# Patient Record
Sex: Male | Born: 1985 | Race: White | Hispanic: No | Marital: Married | Smoking: Never smoker
Health system: Southern US, Community
[De-identification: ages and names within clinical notes are randomized; demographics above are authoritative.]

## PROBLEM LIST (undated history)

## (undated) DIAGNOSIS — IMO0001 Reserved for inherently not codable concepts without codable children: Secondary | ICD-10-CM

## (undated) HISTORY — PX: WISDOM TOOTH EXTRACTION: SHX21

## (undated) HISTORY — DX: Reserved for inherently not codable concepts without codable children: IMO0001

---

## 2014-05-15 ENCOUNTER — Ambulatory Visit: Payer: Self-pay | Admitting: Sports Medicine

## 2015-11-09 ENCOUNTER — Other Ambulatory Visit (HOSPITAL_COMMUNITY)
Admission: RE | Admit: 2015-11-09 | Discharge: 2015-11-09 | Disposition: A | Discharge status: Home / Self Care | Payer: BLUE CROSS/BLUE SHIELD | Source: Ambulatory Visit | Attending: Family Medicine | Admitting: Family Medicine

## 2015-11-09 ENCOUNTER — Ambulatory Visit (INDEPENDENT_AMBULATORY_CARE_PROVIDER_SITE_OTHER): Payer: BLUE CROSS/BLUE SHIELD | Admitting: Family Medicine

## 2015-11-09 ENCOUNTER — Encounter: Payer: Self-pay | Admitting: Family Medicine

## 2015-11-09 VITALS — BP 110/56 | HR 56 | Temp 97.8°F | Ht 74.0 in | Wt 204.0 lb

## 2015-11-09 DIAGNOSIS — Z7251 High risk heterosexual behavior: Secondary | ICD-10-CM | POA: Diagnosis not present

## 2015-11-09 DIAGNOSIS — Z0000 Encounter for general adult medical examination without abnormal findings: Secondary | ICD-10-CM | POA: Diagnosis not present

## 2015-11-09 DIAGNOSIS — Z113 Encounter for screening for infections with a predominantly sexual mode of transmission: Secondary | ICD-10-CM | POA: Diagnosis present

## 2015-11-09 LAB — CBC WITH DIFFERENTIAL/PLATELET
BASOS ABS: 0 10*3/uL (ref 0.0–0.1)
BASOS PCT: 0.5 % (ref 0.0–3.0)
EOS ABS: 0.1 10*3/uL (ref 0.0–0.7)
Eosinophils Relative: 2.2 % (ref 0.0–5.0)
HEMATOCRIT: 40.6 % (ref 39.0–52.0)
Hemoglobin: 13.7 g/dL (ref 13.0–17.0)
LYMPHS PCT: 49.2 % — AB (ref 12.0–46.0)
Lymphs Abs: 2.1 10*3/uL (ref 0.7–4.0)
MCHC: 33.7 g/dL (ref 30.0–36.0)
MCV: 91 fl (ref 78.0–100.0)
MONO ABS: 0.3 10*3/uL (ref 0.1–1.0)
Monocytes Relative: 7.9 % (ref 3.0–12.0)
NEUTROS ABS: 1.7 10*3/uL (ref 1.4–7.7)
Neutrophils Relative %: 40.2 % — ABNORMAL LOW (ref 43.0–77.0)
PLATELETS: 182 10*3/uL (ref 150.0–400.0)
RBC: 4.46 Mil/uL (ref 4.22–5.81)
RDW: 13.5 % (ref 11.5–15.5)
WBC: 4.2 10*3/uL (ref 4.0–10.5)

## 2015-11-09 LAB — POCT URINALYSIS DIPSTICK
Bilirubin, UA: NEGATIVE
Glucose, UA: NEGATIVE
Ketones, UA: NEGATIVE
Leukocytes, UA: NEGATIVE
NITRITE UA: NEGATIVE
PH UA: 6.5
PROTEIN UA: NEGATIVE
RBC UA: NEGATIVE
SPEC GRAV UA: 1.01
UROBILINOGEN UA: 0.2

## 2015-11-09 LAB — COMPREHENSIVE METABOLIC PANEL
ALBUMIN: 4.4 g/dL (ref 3.5–5.2)
ALT: 15 U/L (ref 0–53)
AST: 19 U/L (ref 0–37)
Alkaline Phosphatase: 68 U/L (ref 39–117)
BILIRUBIN TOTAL: 1 mg/dL (ref 0.2–1.2)
BUN: 12 mg/dL (ref 6–23)
CALCIUM: 9.8 mg/dL (ref 8.4–10.5)
CHLORIDE: 103 meq/L (ref 96–112)
CO2: 29 meq/L (ref 19–32)
CREATININE: 0.95 mg/dL (ref 0.40–1.50)
GFR: 100.22 mL/min (ref >60.00)
Glucose, Bld: 94 mg/dL (ref 70–99)
Potassium: 3.8 mEq/L (ref 3.5–5.1)
SODIUM: 139 meq/L (ref 135–145)
Total Protein: 7.2 g/dL (ref 6.0–8.3)

## 2015-11-09 LAB — TSH: TSH: 2.55 u[IU]/mL (ref 0.35–4.50)

## 2015-11-09 LAB — LIPID PANEL
CHOLESTEROL: 202 mg/dL — AB (ref 0–200)
HDL: 98.7 mg/dL (ref >39.00)
LDL Cholesterol: 96 mg/dL (ref 0–99)
NonHDL: 103.06
TRIGLYCERIDES: 34 mg/dL (ref 0.0–149.0)
Total CHOL/HDL Ratio: 2
VLDL: 6.8 mg/dL (ref 0.0–40.0)

## 2015-11-09 NOTE — Progress Notes (Signed)
Tana Conch, MD Phone: [phone]  Subjective:  Patient presents today to establish care as new patient. Prior patient of [facility] [employer]. Chief complaint-noted.   See physical charting below-  Only question patient had was about hemorrhoids. Intermittent issues with rectal pain and bulge.  I advised to avoid prolonged sitting on toilet. Does have hemorrhoid tag but no active hemorrhoid.   The following were reviewed and entered/updated in epic: Past Medical History  Diagnosis Date  . Healthy adult    There are no active problems to display for this patient.  Past Surgical History  Procedure Laterality Date  . Wisdom tooth extraction      Family History  Problem Relation Age of Onset  . Colon polyps Mother     does not think precancerous  . Healthy Father   . Hypertension Paternal Grandfather    Medications- reviewed and updated No current outpatient prescriptions on file.   No current facility-administered medications for this visit.    Allergies-reviewed and updated No Known Allergies  Social History   Social History  . Marital Status: Married    Spouse Name: N/A  . Number of Children: N/A  . Years of Education: N/A   Social History Main Topics  . Smoking status: Never Smoker   . Smokeless tobacco: Not on file  . Alcohol Use: 3.0 - 3.6 oz/week    5-6 Standard drinks or equivalent per week  . Drug Use: No  . Sexual Activity: Not on file   Other Topics Concern  . Not on file   Social History Narrative   Family: Married. 1 dog.       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 Some online graduate level classes as well      Hobbies: playing rugby (recent concussion in November so going to stop). Roller hockey. Some hobbies with wife.    Regular exercise. 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     ROS--Full ROS was completed Review of Systems    Constitutional: Negative for fever and chills.  HENT: Negative for hearing loss.   Eyes: Negative for blurred vision and double vision.  Respiratory: Negative for cough, hemoptysis and shortness of breath.   Cardiovascular: Negative for chest pain and palpitations.  Gastrointestinal: Negative for heartburn, nausea and vomiting.  Genitourinary: Negative for dysuria and urgency.  Musculoskeletal: Negative for myalgias, back pain and neck pain.  Skin: Negative for itching and rash.  Neurological: Negative for dizziness, tingling and headaches.  Endo/Heme/Allergies: Negative for polydipsia. Does not bruise/bleed easily.  Psychiatric/Behavioral: Negative for hallucinations and substance abuse.   Objective: BP 110/56 mmHg  Pulse 56  Temp(Src) 97.8 F (36.6 C)  Ht 6\' 2"  (1.88 m)  Wt 204 lb (92.534 kg)  BMI 26.18 kg/m2 Gen: NAD, resting comfortably HEENT: Mucous membranes are moist. Oropharynx normal. TM normal. Eyes: sclera and lids normal, PERRLA Neck: no thyromegaly, no cervical lymphadenopathy CV: RRR no murmurs rubs or gallops Lungs: CTAB no crackles, wheeze, rhonchi Abdomen: soft/nontender/nondistended/normal bowel sounds. No rebound or guarding.  External rectal: hemorrhoid tag at 12 o clock- no active hemorrhoid Ext: no edema Skin: warm, dry Neuro: 5/5 strength in upper and lower extremities, normal gait, normal reflexes  Assessment/Plan:  30 y.o. male presenting for annual physical.  Health Maintenance counseling: 1. Anticipatory guidance: Patient counseled regarding regular dental exams, eye exams, wearing seatbelts.  2. Risk factor reduction:  Advised  patient of need for regular exercise and diet rich and fruits and vegetables to reduce risk of heart attack and stroke. Goal to stick around 190- recent gain over holidays 3. Immunizations/screenings/ancillary studies- declines flu shot.  4. Prostate cancer screening- start at age 29 unless change in family history  5.  Colon cancer screening - start at age 29 unless change in family history 6. Testicular cancer screening- once a month in shower 7. STD testing- was active before marriage- 1x screen at this point  Patient does mention perhaps mild hyperlipidemia but sounds like high HDL and total as result.   Patient believes he had Tdap within a year (traveled and had them all updated). He will either drop off or bring to next physical Return in about 1 year (around 11/08/2016) for physical. schedule labs a week prior. .   fasting Orders Placed This Encounter  Procedures  . Lipid panel    [place]    Order Specific Question:  Has the patient fasted?    Answer:  No  . Comprehensive metabolic panel    [place]    Order Specific Question:  Has the patient fasted?    Answer:  No  . CBC with Differential/Platelet  . TSH    [place]  . HIV antibody    [facility]  . R[facility]    [facility]  . POCT urinalysis dipstick    In house

## 2015-11-09 NOTE — Patient Instructions (Signed)
Labs before you go  I agree with you weight target around 190 reasonable  No other changes

## 2015-11-10 LAB — HIV ANTIBODY (ROUTINE TESTING W REFLEX): HIV 1&2 Ab, 4th Generation: NONREACTIVE

## 2015-11-10 LAB — RPR

## 2015-11-12 LAB — URINE CYTOLOGY ANCILLARY ONLY
CHLAMYDIA, DNA PROBE: NEGATIVE
NEISSERIA GONORRHEA: NEGATIVE
Trichomonas: NEGATIVE

## 2016-11-18 ENCOUNTER — Ambulatory Visit (INDEPENDENT_AMBULATORY_CARE_PROVIDER_SITE_OTHER): Payer: BLUE CROSS/BLUE SHIELD | Admitting: Family Medicine

## 2016-11-18 ENCOUNTER — Encounter: Payer: Self-pay | Admitting: Family Medicine

## 2016-11-18 VITALS — BP 110/68 | HR 61 | Temp 97.9°F | Ht 73.25 in | Wt 203.0 lb

## 2016-11-18 DIAGNOSIS — Z0000 Encounter for general adult medical examination without abnormal findings: Secondary | ICD-10-CM

## 2016-11-18 NOTE — Progress Notes (Signed)
Pre visit review using our clinic review tool, if applicable. No additional management support is needed unless otherwise documented below in the visit note. 

## 2016-11-18 NOTE — Progress Notes (Signed)
Phone: [phone]  Subjective:  Patient presents today for their annual physical. Chief complaint-noted.   See problem oriented charting- ROS- full  review of systems was completed and negative except for: the atypical chest pain noted below. No shortness of breath, sweating.   The following were reviewed and entered/updated in epic: Past Medical History:  Diagnosis Date  . Healthy adult    There are no active problems to display for this patient.  Past Surgical History:  Procedure Laterality Date  . WISDOM TOOTH EXTRACTION      Family History  Problem Relation Age of Onset  . Colon polyps Mother     does not think precancerous  . Healthy Father   . Hypertension Paternal Grandfather     Medications- reviewed and updated No current outpatient prescriptions on file.   No current facility-administered medications for this visit.     Allergies-reviewed and updated No Known Allergies  Social History   Social History  . Marital status: Married    Spouse name: N/A  . Number of children: N/A  . Years of education: N/A   Social History Main Topics  . Smoking status: Never Smoker  . Smokeless tobacco: Never Used  . Alcohol use 3.0 - 3.6 oz/week    5 - 6 Standard drinks or equivalent per week  . Drug use: No  . Sexual activity: Not Asked   Other Topics Concern  . None   Social History Narrative   Family: Married. 1 dog.       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 Some online graduate level classes as well      Hobbies: playing rugby (recent concussion in November so going to stop). Roller hockey. Some hobbies with wife.    Regular exercise. 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     Objective: BP 110/68 (BP Location: Left Arm, Patient Position: Sitting, Cuff Size: Large)   Pulse 61   Temp 97.9 F (36.6 C) (Oral)   Ht 6' 1.25" (1.861 m)   Wt 203 lb (92.1 kg)   SpO2  98%   BMI 26.60 kg/m  Gen: NAD, resting comfortably HEENT: Mucous membranes are moist. Oropharynx normal Neck: no thyromegaly CV: RRR no murmurs rubs or gallops Lungs: CTAB no crackles, wheeze, rhonchi Abdomen: soft/nontender/nondistended/normal bowel sounds. No rebound or guarding.  Ext: no edema Skin: warm, dry Neuro: grossly normal, moves all extremities, PERRLA  Assessment/Plan:  31 y.o. male presenting for annual physical.  Health Maintenance counseling: 1. Anticipatory guidance: Patient counseled regarding regular dental exams, eye exams, wearing seatbelts.  2. Risk factor reduction:  Advised patient of need for regular exercise and diet rich and fruits and vegetables to reduce risk of heart attack and stroke. 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 Weight stable.  3. Immunizations/screenings/ancillary studies- Flu today. Advised Tdap with baby on the way in march. Thinks he had Tdap within 5 yearsbut would be ok to repeat. So opts in.  4. Prostate cancer screening- no family history, start at age 30 5. Colon cancer screening - no family history, start at age 30 6. Testicular cancer screening- advised once a month self exams 7. Screened for STDs last year- married and monogomous 8. Skin cancer prevention- advised regular sunscreen use. But also wears longsleeve. Does need to put on his face.   Status of chronic or acute concerns  Left sided chest  pain- every once and a while he will get a sharp feeling and feels liek the area tightens lasting 15-30 seconds then goes away. Brother had wolf parkinson white he believes. 3x over last physical. Not exertional. Not relieved by rest.   Return in about 1 year (around 11/18/2017) for physical.  Return precautions advised.   Tana Conch, MD

## 2016-11-18 NOTE — Patient Instructions (Addendum)
Tdap and flu today  Check with insurance if EKG would be covered under preventative services for next years physical.

## 2017-01-02 ENCOUNTER — Encounter: Payer: Self-pay | Admitting: Family Medicine

## 2017-01-26 ENCOUNTER — Ambulatory Visit (INDEPENDENT_AMBULATORY_CARE_PROVIDER_SITE_OTHER): Payer: BLUE CROSS/BLUE SHIELD | Admitting: Family Medicine

## 2017-01-26 ENCOUNTER — Encounter: Payer: Self-pay | Admitting: Family Medicine

## 2017-01-26 VITALS — BP 120/70 | HR 90 | Temp 99.1°F | Ht 73.25 in | Wt 201.8 lb

## 2017-01-26 DIAGNOSIS — J111 Influenza due to unidentified influenza virus with other respiratory manifestations: Secondary | ICD-10-CM | POA: Diagnosis not present

## 2017-01-26 NOTE — Progress Notes (Signed)
PCP: Tana Conch, MD  Subjective:  Kyle Brennan is a 31 y.o. year old very pleasant male patient who presents with flu like symptoms including elevated temperature, severe fatigue,body aches.  -other symptoms: minimal cough and congestion. Some whitish sputum. One loose BM yesterday. Some nausea on zofran -started: Sunday morning -inside 48 hour treatment window if needed for tamiflu: yes because used telehealth within 24 hours of symptoms -high risk condition (children <5, adults >65, chronic pulmonary or cardiac condition, immunosuppression, pregnancy, nursing home resident, morbid obesity) : no but wife is pregnant and due this week -symptoms are worsening -previous treatments: acetaminophen, ibuprofen - patient did  receive flu shot this year.  Immunization History  Administered Date(s) Administered  . Influenza-Unspecified 11/18/2016  . 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 No headache  Pertinent Past Medical History- healthy adult, nonsmoker.   Medications- reviewed  Current Outpatient Prescriptions  Medication Sig Dispense Refill  . ondansetron (ZOFRAN-ODT) 4 MG disintegrating tablet Take 1 tablet by mouth as needed.  0  . oseltamivir (TAMIFLU) 75 MG capsule Take 1 capsule by mouth 2 (two) times daily.  0   No current facility-administered medications for this visit.     Objective: BP 120/70 (BP Location: Left Arm, Patient Position: Sitting, Cuff Size: Large)   Pulse 90   Temp 99.1 F (37.3 C) (Oral)   Ht 6' 1.25" (1.861 m)   Wt 201 lb 12.8 oz (91.5 kg)   SpO2 96%   BMI 26.44 kg/m  Gen: NAD, appears fatigued HEENT: Turbinates erythematous- some clear drainage, TM normal, pharynx mildly erythematous with no tonsilar exudate or edema, no sinus tenderness CV: RRR no murmurs rubs or gallops Lungs: CTAB no crackles, wheeze, rhonchi Abdomen:  soft/nontender/nondistended/normal bowel sounds. Ext: no edema Skin: warm, dry, no rash  Assessment/Plan:  Flu-like illness History and exam today are suggestive of viral process. Patients influenza test was negative.  Pretest probability of influenza was high. High risk condition: no, but wife is pregnant and late term.   Patient will continue treatment with Tamiflu. Symptomatic treatment with: can use zofran for nausea- I could call in more if needed. Alternate tylenol and ibuprofen up to every 3 hours for body aches  Finally, we reviewed reasons to return to care including if symptoms worsen or persist or new concerns arise.  Meds ordered this encounter  Medications  . oseltamivir (TAMIFLU) 75 MG capsule    Sig: Take 1 capsule by mouth 2 (two) times daily.    Refill:  0  . ondansetron (ZOFRAN-ODT) 4 MG disintegrating tablet    Sig: Take 1 tablet by mouth as needed.    Refill:  0    Tana Conch, MD

## 2017-01-26 NOTE — Progress Notes (Signed)
Pre visit review using our clinic review tool, if applicable. No additional management support is needed unless otherwise documented below in the visit note. 

## 2017-01-26 NOTE — Patient Instructions (Signed)
Flu-like illness History and exam today are suggestive of viral process. Patients influenza test was negative.  Pretest probability of influenza was high. High risk condition: no, but wife is pregnant and late term.   Patient will continue treatment with Tamiflu. Symptomatic treatment with: can use zofran for nausea- I could call in more if needed. Alternate tylenol and ibuprofen up to every 3 hours for body aches  Finally, we reviewed reasons to return to care including if symptoms worsen or persist or new concerns arise.

## 2017-02-26 ENCOUNTER — Encounter: Payer: Self-pay | Admitting: Family Medicine

## 2017-02-27 ENCOUNTER — Other Ambulatory Visit: Payer: Self-pay

## 2017-02-27 DIAGNOSIS — M25562 Pain in left knee: Secondary | ICD-10-CM

## 2017-03-04 ENCOUNTER — Ambulatory Visit: Payer: Self-pay

## 2017-03-04 ENCOUNTER — Encounter: Payer: Self-pay | Admitting: Sports Medicine

## 2017-03-04 ENCOUNTER — Ambulatory Visit (INDEPENDENT_AMBULATORY_CARE_PROVIDER_SITE_OTHER): Payer: BLUE CROSS/BLUE SHIELD | Admitting: Sports Medicine

## 2017-03-04 VITALS — BP 120/88 | HR 81 | Ht 73.25 in | Wt 201.6 lb

## 2017-03-04 DIAGNOSIS — M25562 Pain in left knee: Secondary | ICD-10-CM

## 2017-03-04 DIAGNOSIS — M25569 Pain in unspecified knee: Secondary | ICD-10-CM | POA: Insufficient documentation

## 2017-03-04 MED ORDER — DICLOFENAC SODIUM 2 % TD SOLN: 1.0000 "application " | Freq: Two times a day (BID) | TRANSDERMAL | 2 refills | Status: DC

## 2017-03-04 MED ORDER — DICLOFENAC SODIUM 2 % TD SOLN: 1.0000 "application " | Freq: Two times a day (BID) | TRANSDERMAL | 0 refills | Status: AC

## 2017-03-04 NOTE — Patient Instructions (Addendum)
Please perform the exercise program that Fayrene FearingJames has prepared for you and gone over in detail on a daily basis.  In addition to the handout you were provided you can access your program through: [email or web address]   Your unique program code is: [id number]   I recommend you obtained a compression sleeve to help with your joint problems. There are many options on the market however I recommend obtaining a FULL KNEE Body Helix compression sleeve.  You can find information (including how to appropriate measure yourself for sizing) can be found at [email or web address].  Many of these products are health savings account (HSA) eligible.   You can use the compression sleeve at any time throughout the day but is most important to use while being active as well as for 2 hours post-activity.   It is appropriate to ice following activity with the compression sleeve in place.

## 2017-03-04 NOTE — Progress Notes (Signed)
OFFICE VISIT NOTE [name] [phone]  Kyle Brennan - 31 y.o. male MRN [id number]  Date of birth: Oct 04, 1986  Visit Date: 03/04/2017  PCP: Kyle Majestic, MD   Referred by: Kyle Majestic, MD  Clovis Cao, cma acting as scribe for Dr. Berline Chough.  SUBJECTIVE:   Chief Complaint  Patient presents with  . NP: LEFT Knee Pain   HPI: As below and per problem based documentation when appropriate.  Kyle Brennan noticed specific dull, achiness without injury. Approximately the beginning of March sx have gradually worsened. Location is medial side of knee. No radiation of pain. 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 He feels clicking, popping and discomfort. No pain at rest. Took OTC supplements for joints but unsure of any changes.   Denies fevers, chills, recent weight gain or weight loss.  No night sweats. No significant nighttime awakenings due to this issue.   Knee Pain      Review of Systems  Constitutional: Negative.   HENT: Negative.   Eyes: Negative.   Respiratory: Negative.   Cardiovascular: Negative.   Gastrointestinal: Negative.   Genitourinary: Negative.   Musculoskeletal: Positive for joint pain.  Skin: Negative.   Neurological: Negative.   Endo/Heme/Allergies: Negative.   Psychiatric/Behavioral: Negative.     Otherwise per HPI.  HISTORY & PERTINENT PRIOR DATA:  No specialty comments available. He reports that he has never smoked. He has never used smokeless tobacco. No results for input(s): HGBA1C, LABURIC in the last 8760 hours. Medications & Allergies reviewed per EMR Patient Active Problem List   Diagnosis Date Noted  . Knee pain, acute 03/04/2017   Past Medical History:  Diagnosis Date  . Healthy adult    Family History  Problem Relation Age of Onset  . Colon polyps Mother     does not think precancerous  . Healthy Father   .  Hypertension Paternal Grandfather    Past Surgical History:  Procedure Laterality Date  . WISDOM TOOTH EXTRACTION     Social History   Occupational History  . Not on file.   Social History Main Topics  . Smoking status: Never Smoker  . Smokeless tobacco: Never Used  . Alcohol use 3.0 - 3.6 oz/week    5 - 6 Standard drinks or equivalent per week  . Drug use: No  . Sexual activity: Not on file    OBJECTIVE:  VS:  HT:6' 1.25" (186.1 cm)   WT:201 lb 9.6 oz (91.4 kg)  BMI:26.5    BP:120/88  HR:81bpm  TEMP: ( )  RESP:98 % EXAM: Findings:  WDWN, NAD, Non-toxic appearing Alert & appropriately interactive Not depressed or anxious appearing No increased work of breathing. Pupils are equal. EOM intact without nystagmus No clubbing or cyanosis of the extremities appreciated No significant rashes/lesions/ulcerations overlying the examined area. DP & PT pulses 2+/4.  No significant pretibial edema.  No clubbing or cyanosis  LEFT Knee: LE Sensation intact to light touch. Overall joint is well aligned, no significant deformity.   No significant effusion.   ROM: 0 to 120.   Extensor mechanism intact No significant medial or lateral joint line tenderness.   Stable to varus/valgus strain & anterior/posterior drawer.  Normal Lachman's.   Negative McMurray's and Thessaly. Marked cavitation when going into extension from terminal squat.  Cavitation is localized to the anterior lateral aspect of the knee.   +++++++++++++++++++++++++++++++++++++++++++++++++++++++++++++++++++++++++++++ LIMITED MSK  ULTRASOUND OF LEFT KNEE Images were obtained and interpreted by myself, Gaspar BiddingMichael Rigby, DO  Images have been saved and stored to PACS system. Images obtained on: GE S7 Ultrasound machine  FINDINGS:  Patella & Patellar Tendon: Normal Quad & Quad Tendon: Normal Suprapatellar Pouch: Normal, no effusion Medial Joint Line: Normal medial meniscus Lateral Joint Line: Large swelling of the  lateral meniscus with midportion cystic changes consistent with a large para meniscal cyst.  This possibly tracks medially towards the anterior aspect of the knee however is incompletely visualized. Trochlea: Normal appearing cartilage Posterior knee: n/a  IMPRESSION:  Large lateral para meniscal cyst      No results found. ASSESSMENT & PLAN:   Problem List Items Addressed This Visit    Knee pain, acute - Primary    Large amount of swelling with para meniscal cyst of the lateral meniscus. Pennsaid topically, injection therapy discussed patient would like to hold off and try conservative measures initially. Body Healix compression sleeve VMO strengthening and hip abduction strengthening for knee maintenance.  Follow-up in 4 weeks for reevaluation.  If persistent mechanical symptoms injection versus MRI will need to be considered      Relevant Medications   Diclofenac Sodium (PENNSAID) 2 % SOLN   Diclofenac Sodium (PENNSAID) 2 % SOLN   Other Relevant Orders   US LIMITED JOINT SPACE STRUCTURES LOW LEFT(NO LINKED CHARGES)     Follow-up: Return in about 4 weeks (around 04/01/2017).   CMA/ATC served as Neurosurgeonscribe during this visit. History, Physical, and Plan performed by medical provider. Documentation and orders reviewed and attested to.      Gaspar BiddingMichael Rigby, DO    [facility] Phys[facility]    03/04/2017 10:44 PM

## 2017-03-04 NOTE — Assessment & Plan Note (Addendum)
Large amount of swelling with para meniscal cyst of the lateral meniscus. Pennsaid topically, injection therapy discussed patient would like to hold off and try conservative measures initially. Body Healix compression sleeve VMO strengthening and hip abduction strengthening for knee maintenance.  Follow-up in 4 weeks for reevaluation.  If persistent mechanical symptoms injection versus MRI will need to be considered  +++++++++++++++++++++++++++++++++++++++++++++++++++++++++++++++ PROCEDURE NOTE: THERAPEUTIC EXERCISES (97110) 15 minutes spent for Therapeutic exercises as stated in above notes.  This included exercises focusing on stretching, strengthening, with significant focus on eccentric aspects.   Proper technique shown and discussed handout in great detail with ATC.  All questions were discussed and answered.

## 2017-03-16 ENCOUNTER — Telehealth: Payer: Self-pay | Admitting: Sports Medicine

## 2017-03-16 NOTE — Telephone Encounter (Signed)
Patient returned today to purchase a full knee Body Helix compression sleeve.  This was fitted and provided for him today.  Follow-up as scheduled otherwise.

## 2017-04-01 ENCOUNTER — Ambulatory Visit: Payer: BLUE CROSS/BLUE SHIELD | Admitting: Sports Medicine

## 2017-04-13 ENCOUNTER — Ambulatory Visit: Payer: Self-pay

## 2017-04-13 ENCOUNTER — Ambulatory Visit (INDEPENDENT_AMBULATORY_CARE_PROVIDER_SITE_OTHER): Payer: BLUE CROSS/BLUE SHIELD | Admitting: Sports Medicine

## 2017-04-13 ENCOUNTER — Encounter: Payer: Self-pay | Admitting: Sports Medicine

## 2017-04-13 VITALS — BP 118/86 | HR 73 | Ht 73.25 in | Wt 205.6 lb

## 2017-04-13 DIAGNOSIS — M25562 Pain in left knee: Secondary | ICD-10-CM

## 2017-04-13 NOTE — Assessment & Plan Note (Signed)
Small amount of pain today that is only minimal.  Mechanical symptoms have essentially resolved and we have been very infrequently this time.  He did not tolerate the Pennsaid but was reportedly rubbing this and quite vigorously.  He will continue with Body Helix compression sleeve, begin working on therapeutic exercises on a 3-5 times per week basis and will plan follow-up with us on a as needed basis if any lack of improvement.  Okay to try the Pennsaid again if persistent pain should avoid rubbing the area.

## 2017-04-13 NOTE — Progress Notes (Signed)
OFFICE VISIT NOTE Veverly FellsMichael D. Delorise Shinerigby, DO  [facility] [place] [phone]  Kyle Brennan - 31 y.o. male MRN [id number]  Date of birth: [birth year]  Visit Date: 04/13/2017  PCP: Shelva MajesticHunter, Stephen O, MD   Referred by: Shelva MajesticHunter, Stephen O, MD  Kyle DakinBrandy Shelton, CMA acting as scribe for Dr. Berline Choughigby.  SUBJECTIVE:   Chief Complaint  Patient presents with  . 4 Week Follow Up  . Left Knee Pain   HPI: As below and per problem based documentation when appropriate.   Kyle Brennan presents today in follow-up of left knee pain. Pt was given Pennsaid and found little relief when using this. He reports that it causes his leg to become dry. He has been using the Body Helix and finds some relief with this. He wears it mostly while he is working. He feels as if the knee is weaker or aches more after removing the brace. He reports that he has not been very compliant with his home exercises because of everything he has going on at home with the new baby. He reports that his knee is about the same as at the last visit, still has some swelling and he feels pressure in the knee when squatting. He did notice increased pain last week on the posterior aspect of the knee but rested it and iced it and that has improved. He and his wife are interested in starting Yoga again, he would like to see if stretching helps with his knee. He also reports that for the past 1.5 weeks he has not heard or felt the clicking sensation that he had before.   Pt denies fever, chills, night sweats.     Review of Systems  Constitutional: Negative for chills and fever.  Respiratory: Negative for shortness of breath and wheezing.   Cardiovascular: Positive for leg swelling. Negative for chest pain and palpitations.  Musculoskeletal: Positive for joint pain. Negative for falls.  Neurological: Negative for dizziness, tingling and headaches.  Endo/Heme/Allergies: Does not bruise/bleed easily.   Otherwise per HPI.  HISTORY & PERTINENT PRIOR DATA:  No specialty comments available. He reports that he has never smoked. He has never used smokeless tobacco. No results for input(s): HGBA1C, LABURIC in the last 8760 hours. Medications & Allergies reviewed per EMR Patient Active Problem List   Diagnosis Date Noted  . Knee pain, acute 03/04/2017   Past Medical History:  Diagnosis Date  . Healthy adult    Family History  Problem Relation Age of Onset  . Colon polyps Mother        does not think precancerous  . Healthy Father   . Hypertension Paternal Grandfather    Past Surgical History:  Procedure Laterality Date  . WISDOM TOOTH EXTRACTION     Social History   Occupational History  . Not on file.   Social History Main Topics  . Smoking status: Never Smoker  . Smokeless tobacco: Never Used  . Alcohol use 3.0 - 3.6 oz/week    5 - 6 Standard drinks or equivalent per week  . Drug use: No  . Sexual activity: Not on file    OBJECTIVE:  VS:  HT:6' 1.25" (186.1 cm)   WT:205 lb 9.6 oz (93.3 kg)  BMI:27    BP:118/86  HR:73bpm  TEMP: ( )  RESP:97 % EXAM: Findings:  WDWN, NAD, Non-toxic appearing Alert & appropriately interactive Not depressed or anxious appearing No increased work of breathing. Pupils are equal. EOM intact without  nystagmus No clubbing or cyanosis of the extremities appreciated No significant rashes/lesions/ulcerations overlying the examined area. DP & PT pulses 2+/4.  No significant pretibial edema. Sensation intact to light touch in lower extremities.  Left knee: Well aligned.  No significant deformity.  No effusion.  He is ligamentously stable.  Minimal pain with McMurray's testing without reproducible click.  He does have a small mechanical click with Thessaly.     No results found. ASSESSMENT & PLAN:   Problem List Items Addressed This Visit    Knee pain, acute - Primary    Small amount of pain today that is only minimal.  Mechanical  symptoms have essentially resolved and we have been very infrequently this time.  He did not tolerate the Pennsaid but was reportedly rubbing this and quite vigorously.  He will continue with Body Helix compression sleeve, begin working on therapeutic exercises on a 3-5 times per week basis and will plan follow-up with [place] on a as needed basis if any lack of improvement.  Okay to try the Pennsaid again if persistent pain should avoid rubbing the area.      Relevant Orders   [place] LIMITED JOINT SPACE STRUCTURES LOW LEFT(NO LINKED CHARGES)      Follow-up: Return if symptoms worsen or fail to improve.   CMA/ATC served as Neurosurgeon during this visit. History, Physical, and Plan performed by medical provider. Documentation and orders reviewed and attested to.      Gaspar Bidding, DO    [facility] Phys[facility]

## 2018-04-09 DIAGNOSIS — F4322 Adjustment disorder with anxiety: Secondary | ICD-10-CM | POA: Diagnosis not present

## 2018-04-13 DIAGNOSIS — F4322 Adjustment disorder with anxiety: Secondary | ICD-10-CM | POA: Diagnosis not present

## 2018-05-12 DIAGNOSIS — F4322 Adjustment disorder with anxiety: Secondary | ICD-10-CM | POA: Diagnosis not present

## 2018-11-02 ENCOUNTER — Telehealth: Payer: Self-pay | Admitting: Family Medicine

## 2018-11-02 NOTE — Telephone Encounter (Signed)
Copied from CRM [id number]. Topic: Appointment Scheduling - Scheduling Inquiry for Clinic >> Nov 02, 2018  9:34 AM Maia Petties wrote: Reason for CRM: pt called stating he needs cpe before 12/09/2018. He will be traveling out of the country for 3 months. Pt asking if he cannot be fit in with Dr. Durene Cal that he see another provider for cpe. Please advise.

## 2018-11-04 NOTE — Telephone Encounter (Signed)
I am okay with 8 AM on the 27th-it will be 3 physicals for the morning-lets ask him to arrive at 745 to allow adequate time for the visit-really need him to show up at that time since we are working him in.  It looks like there is only 1 block for that morning-we need to block the 1120 same-day slot to allow for catch-up time.

## 2018-11-05 NOTE — Telephone Encounter (Signed)
Called patient he said that was fine and he would be here at 745.

## 2018-11-05 NOTE — Telephone Encounter (Signed)
This has been scheduled. Please call patient and let him know that Dr. Durene Cal is able to work him in on 01/27 and to arrive at 7:45.

## 2018-11-22 ENCOUNTER — Encounter: Payer: Self-pay | Admitting: Family Medicine

## 2018-11-22 ENCOUNTER — Ambulatory Visit (INDEPENDENT_AMBULATORY_CARE_PROVIDER_SITE_OTHER): Payer: BLUE CROSS/BLUE SHIELD | Admitting: Family Medicine

## 2018-11-22 VITALS — BP 124/74 | HR 54 | Temp 97.7°F | Ht 73.25 in | Wt 204.2 lb

## 2018-11-22 DIAGNOSIS — Z0000 Encounter for general adult medical examination without abnormal findings: Secondary | ICD-10-CM | POA: Diagnosis not present

## 2018-11-22 DIAGNOSIS — Z13220 Encounter for screening for lipoid disorders: Secondary | ICD-10-CM

## 2018-11-22 DIAGNOSIS — Z79899 Other long term (current) drug therapy: Secondary | ICD-10-CM

## 2018-11-22 DIAGNOSIS — Z23 Encounter for immunization: Secondary | ICD-10-CM | POA: Diagnosis not present

## 2018-11-22 DIAGNOSIS — Z6826 Body mass index (BMI) 26.0-26.9, adult: Secondary | ICD-10-CM

## 2018-11-22 LAB — CBC
HEMATOCRIT: 41.5 % (ref 39.0–52.0)
HEMOGLOBIN: 14 g/dL (ref 13.0–17.0)
MCHC: 33.8 g/dL (ref 30.0–36.0)
MCV: 91.3 fl (ref 78.0–100.0)
PLATELETS: 208 10*3/uL (ref 150.0–400.0)
RBC: 4.55 Mil/uL (ref 4.22–5.81)
RDW: 13.3 % (ref 11.5–15.5)
WBC: 4.1 10*3/uL (ref 4.0–10.5)

## 2018-11-22 LAB — LIPID PANEL
CHOLESTEROL: 263 mg/dL — AB (ref 0–200)
HDL: 107.1 mg/dL (ref >39.00)
LDL CALC: 148 mg/dL — AB (ref 0–99)
NonHDL: 156.15
TRIGLYCERIDES: 41 mg/dL (ref 0.0–149.0)
Total CHOL/HDL Ratio: 2
VLDL: 8.2 mg/dL (ref 0.0–40.0)

## 2018-11-22 LAB — COMPREHENSIVE METABOLIC PANEL
ALBUMIN: 4.7 g/dL (ref 3.5–5.2)
ALT: 39 U/L (ref 0–53)
AST: 27 U/L (ref 0–37)
Alkaline Phosphatase: 52 U/L (ref 39–117)
BUN: 9 mg/dL (ref 6–23)
CALCIUM: 9.9 mg/dL (ref 8.4–10.5)
CHLORIDE: 103 meq/L (ref 96–112)
CO2: 28 meq/L (ref 19–32)
CREATININE: 0.98 mg/dL (ref 0.40–1.50)
GFR: 89.1 mL/min (ref >60.00)
Glucose, Bld: 92 mg/dL (ref 70–99)
POTASSIUM: 3.7 meq/L (ref 3.5–5.1)
Sodium: 139 mEq/L (ref 135–145)
Total Bilirubin: 0.8 mg/dL (ref 0.2–1.2)
Total Protein: 7 g/dL (ref 6.0–8.3)

## 2018-11-22 NOTE — Patient Instructions (Addendum)
Flu shot today before you leave  Please stop by lab before you go If you do not have mychart- we will call you about results within 5 business days of [place] receiving them.  If you have mychart- we will send your results within 3 business days of [place] receiving them.  If abnormal or we want to clarify a result, we will call or mychart you to make sure you receive the message.  If you have questions or concerns or don't hear within 5-7 days, please send [place] a message or call us.

## 2018-11-22 NOTE — Progress Notes (Signed)
Phone: [phone]    Subjective:  Patient presents today for their annual physical. Chief complaint-noted.   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 Encouraged need for healthy eating, regular exercise, weight loss.   BMI Metric Follow Up - 11/22/18 0823      BMI Metric Follow Up-Please document annually   BMI Metric Follow Up  Education provided   I think he is at a healthy weight for a male      The following were reviewed and entered/updated in epic: Past Medical History:  Diagnosis Date  . Healthy adult    Patient Active Problem List   Diagnosis Date Noted  . Knee pain, acute 03/04/2017   Past Surgical History:  Procedure Laterality Date  . WISDOM TOOTH EXTRACTION      Family History  Problem Relation Age of Onset  . Colon polyps Mother        does not think precancerous  . Healthy Father   . Hypertension Paternal Grandfather     Medications- reviewed and updated Current Outpatient Medications  Medication Sig Dispense Refill  . glucosamine-chondroitin 500-400 MG tablet Take 1 tablet by mouth 3 (three) times daily.    Marland Kitchen ibuprofen (ADVIL,MOTRIN) 200 MG tablet Take 200 mg by mouth every 6 (six) hours as needed.    . TURMERIC PO Take by mouth.     No current facility-administered medications for this visit.     Allergies-reviewed and updated No Known Allergies  Social History   Social History Narrative   Family: Married. 1 dog.       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 Some online graduate level classes as well      Hobbies: playing rugby (recent concussion in November so going to stop). Roller hockey. Some hobbies with wife.    Regular exercise. Also a lot of walking on the  farm.       Objective:  BP 124/74 (BP Location: Left Arm, Patient Position: Sitting, Cuff Size: Large)   Pulse (!) 54   Temp 97.7 F (36.5 C) (Oral)   Ht 6' 1.25" (1.861 m)   Wt 204 lb 3.2 oz (92.6 kg)   SpO2 98%   BMI 26.76 kg/m  Gen: NAD, resting comfortably HEENT: Mucous membranes are moist. Oropharynx normal Neck: no thyromegaly CV: RRR no murmurs rubs or gallops Lungs: CTAB no crackles, wheeze, rhonchi Abdomen: soft/nontender/nondistended/normal bowel sounds. No rebound or guarding.  Ext: no edema Skin: warm, dry Neuro: grossly normal, moves all extremities, PERRLA MSK: Slight drop of the arch on the right foot as compared to the left-some pain with palpation around midfoot but mild.    Assessment and Plan:  33 y.o. male presenting for annual physical.  Health Maintenance counseling: 1. 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   2. Risk factor reduction:  Advised patient of need for regular exercise and diet rich and fruits and vegetables to reduce risk of heart attack and stroke. Exercise- 2x  a week but active with the farm. Diet-trying some intermittent fasting- avoiding eating after 8 and then eating at noon the next day.  Wt Readings from Last 3 Encounters:  11/22/18 204 lb 3.2 oz (92.6 kg)  04/13/17 205 lb 9.6 oz (93.3 kg)  03/04/17 201 lb 9.6 oz (91.4 kg)  3. Immunizations/screenings/ancillary studies- flu shot today  Immunization History  Administered Date(s) Administered  . Influenza,inj,Quad PF,6+ Mos 11/22/2018  . Influenza-Unspecified 11/18/2016  . Tdap 11/18/2016  4. Prostate cancer screening- no family history, start at age 22   5. Colon cancer screening - no family history, start at  age 37.  Mom did have a polyp in her past but was told benign-was not recommended to have other family members screened early 6. Skin cancer screening/prevention- no dermatolgist. advised regular sunscreen use. Denies worrisome, changing, or new skin lesions.  7. Testicular cancer screening- advised monthly self exams  8. STD screening- patient opts out as monogomous 9.  Never smoker-   Status of chronic or acute concerns  Prior left sided chest pain- that has resolved. We considered EKG last physical but were not sure if would be covered -he checked and would have been but luckily symptoms resolved  Leading study abroad program to [place]- will be there for 3 months  Patient also has some right midfoot pain.  Associated with a slight drop of his arch.  We discussed wearing good supportive shoes with arch support.  We discussed icing.  We discussed turmeric and glucosamine being restarted and using consistently- also offered referral to Dr. Berline Chough of sports medicine which he declines for now but he will call if he would like to do this-right now pain is improving so he wants to hold off  1 year physical likely that we did not discuss this specifically  Lab/Order associations: FASTING  Preventative health care - Plan: CBC, Comprehensive metabolic panel, Lipid panel  High risk medication use - Plan: CBC, Comprehensive metabolic panel  Screening for hyperlipidemia - Plan: Lipid panel  BMI 26.0-26.9,adult  Need for prophylactic vaccination and inoculation against influenza - Plan: Flu Vaccine QUAD 36+ mos IM  Return precautions advised.  Tana Conch, MD

## 2019-05-31 DIAGNOSIS — Z7189 Other specified counseling: Secondary | ICD-10-CM | POA: Diagnosis not present

## 2019-05-31 DIAGNOSIS — Z20828 Contact with and (suspected) exposure to other viral communicable diseases: Secondary | ICD-10-CM | POA: Diagnosis not present

## 2019-07-25 DIAGNOSIS — Z23 Encounter for immunization: Secondary | ICD-10-CM | POA: Diagnosis not present

## 2019-09-13 DIAGNOSIS — Z03818 Encounter for observation for suspected exposure to other biological agents ruled out: Secondary | ICD-10-CM | POA: Diagnosis not present

## 2019-09-14 DIAGNOSIS — Z20828 Contact with and (suspected) exposure to other viral communicable diseases: Secondary | ICD-10-CM | POA: Diagnosis not present

## 2019-12-27 DIAGNOSIS — Z7189 Other specified counseling: Secondary | ICD-10-CM | POA: Diagnosis not present

## 2019-12-27 DIAGNOSIS — Z20828 Contact with and (suspected) exposure to other viral communicable diseases: Secondary | ICD-10-CM | POA: Diagnosis not present

## 2020-02-07 DIAGNOSIS — Z03818 Encounter for observation for suspected exposure to other biological agents ruled out: Secondary | ICD-10-CM | POA: Diagnosis not present

## 2020-06-08 DIAGNOSIS — Z03818 Encounter for observation for suspected exposure to other biological agents ruled out: Secondary | ICD-10-CM | POA: Diagnosis not present

## 2020-07-10 DIAGNOSIS — Z03818 Encounter for observation for suspected exposure to other biological agents ruled out: Secondary | ICD-10-CM | POA: Diagnosis not present

## 2020-07-30 DIAGNOSIS — Z03818 Encounter for observation for suspected exposure to other biological agents ruled out: Secondary | ICD-10-CM | POA: Diagnosis not present

## 2020-08-22 DIAGNOSIS — Z03818 Encounter for observation for suspected exposure to other biological agents ruled out: Secondary | ICD-10-CM | POA: Diagnosis not present

## 2020-08-28 DIAGNOSIS — Z03818 Encounter for observation for suspected exposure to other biological agents ruled out: Secondary | ICD-10-CM | POA: Diagnosis not present

## 2020-08-28 DIAGNOSIS — Z20822 Contact with and (suspected) exposure to covid-19: Secondary | ICD-10-CM | POA: Diagnosis not present

## 2020-09-25 DIAGNOSIS — Z03818 Encounter for observation for suspected exposure to other biological agents ruled out: Secondary | ICD-10-CM | POA: Diagnosis not present

## 2020-11-23 DIAGNOSIS — Z1159 Encounter for screening for other viral diseases: Secondary | ICD-10-CM | POA: Diagnosis not present

## 2020-12-18 DIAGNOSIS — Z03818 Encounter for observation for suspected exposure to other biological agents ruled out: Secondary | ICD-10-CM | POA: Diagnosis not present

## 2021-01-07 DIAGNOSIS — Z20822 Contact with and (suspected) exposure to covid-19: Secondary | ICD-10-CM | POA: Diagnosis not present

## 2021-01-07 DIAGNOSIS — Z03818 Encounter for observation for suspected exposure to other biological agents ruled out: Secondary | ICD-10-CM | POA: Diagnosis not present

## 2021-06-04 ENCOUNTER — Encounter: Payer: Self-pay | Admitting: Family Medicine

## 2021-11-05 ENCOUNTER — Other Ambulatory Visit: Payer: Self-pay

## 2021-11-05 ENCOUNTER — Encounter: Payer: Self-pay | Admitting: Family Medicine

## 2021-11-05 ENCOUNTER — Ambulatory Visit (INDEPENDENT_AMBULATORY_CARE_PROVIDER_SITE_OTHER): Payer: BC Managed Care – PPO | Admitting: Family Medicine

## 2021-11-05 VITALS — BP 120/78 | HR 83 | Temp 98.3°F | Ht 74.0 in | Wt 219.0 lb

## 2021-11-05 DIAGNOSIS — Z23 Encounter for immunization: Secondary | ICD-10-CM | POA: Diagnosis not present

## 2021-11-05 DIAGNOSIS — Z1159 Encounter for screening for other viral diseases: Secondary | ICD-10-CM | POA: Diagnosis not present

## 2021-11-05 DIAGNOSIS — M79645 Pain in left finger(s): Secondary | ICD-10-CM

## 2021-11-05 DIAGNOSIS — M545 Low back pain, unspecified: Secondary | ICD-10-CM

## 2021-11-05 DIAGNOSIS — Z1283 Encounter for screening for malignant neoplasm of skin: Secondary | ICD-10-CM

## 2021-11-05 DIAGNOSIS — E785 Hyperlipidemia, unspecified: Secondary | ICD-10-CM | POA: Diagnosis not present

## 2021-11-05 DIAGNOSIS — Z0000 Encounter for general adult medical examination without abnormal findings: Secondary | ICD-10-CM

## 2021-11-05 NOTE — Patient Instructions (Addendum)
Health Maintenance Due  Topic Date Due   INFLUENZA VACCINE  - Flu shot today before you leave  -update Tdap with baby on the way 05/27/2021   Send [place] the dates of your covid shots  We will call you within two weeks about your referral to Dermatology and sports medicine referral to Dr. Paulla Fore. If you do not hear within 2 weeks, give [place] a call.   Recommended follow up: Return in about 1 year (around 11/05/2022) for physical or sooner if needed.

## 2021-11-05 NOTE — Addendum Note (Signed)
Addended by: Charlyne Mom D on: 11/05/2021 04:25 PM   Modules accepted: Orders

## 2021-11-05 NOTE — Progress Notes (Signed)
Phone: [phone]    Subjective:  Patient presents today for their annual physical. Chief complaint-noted.   See problem oriented charting- ROS- full  review of systems was completed and negative  except for: finger pain, some back issues off and on- injury at end of college on sacrum- some sciatic pain symptoms in past- comes and goes.   The following were reviewed and entered/updated in epic: Past Medical History:  Diagnosis Date   Healthy adult    Patient Active Problem List   Diagnosis Date Noted   Knee pain, acute 03/04/2017   Past Surgical History:  Procedure Laterality Date   WISDOM TOOTH EXTRACTION      Family History  Problem Relation Age of Onset   Colon polyps Mother        does not think precancerous   Healthy Father    Hypertension Paternal Grandfather     Medications- reviewed and updated Current Outpatient Medications  Medication Sig Dispense Refill   glucosamine-chondroitin 500-400 MG tablet Take 1 tablet by mouth 3 (three) times daily.     ibuprofen (ADVIL,MOTRIN) 200 MG tablet Take 200 mg by mouth every 6 (six) hours as needed.     TURMERIC PO Take by mouth.     No current facility-administered medications for this visit.    Allergies-reviewed and updated No Known Allergies  Social History   Social History Narrative   Family: Married. Son 2018 and daughter due march 2023- lkely last child.       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 [employer] in dec 2022 as well.       Hobbies: playing rugby but stopped due to concussion.  hockey once a week.    Regular exercise. 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       Objective:  BP 120/78    Pulse 83    Temp 98.3 F (36.8 C)    Ht 6\' 2"  (1.88 m)    Wt 219 lb (99.3 kg)    SpO2 98%    BMI 28.12 kg/m  Gen: NAD, resting comfortably HEENT: Mucous membranes are moist. Oropharynx normal Neck: no  thyromegaly CV: RRR no murmurs rubs or gallops Lungs: CTAB no crackles, wheeze, rhonchi Abdomen: soft/nontender/nondistended/normal bowel sounds. No rebound or guarding.  Ext: no edema Skin: warm, dry Neuro: grossly normal, moves all extremities, PERRLA Mild swelling around 4th PIP joint- more on radial side. Back exam essentially norma- reports pain is deeper. No midline pain     Assessment and Plan:  36 y.o. male presenting for annual physical.  Health Maintenance counseling: 1. 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   2. Risk factor reduction:  Advised patient of need for regular exercise and diet rich and fruits and vegetables to reduce risk of heart attack and stroke.  Exercise- in last physical was doing 2x per week and active with farm- exercise has been down with busy schedule but maintained once a week hockey Diet/weight management-intermittent fasting last physical, considering restarting as has had good success in past- I think working back towards 200 is reasonable Wt Readings from Last 3 Encounters:  11/05/21 219 lb (99.3 kg)  11/22/18 204  lb 3.2 oz (92.6 kg)  04/13/17 205 lb 9.6 oz (93.3 kg)  3. Immunizations/screenings/ancillary studies- discuss Flu shot-plans today - otherwise immunizations are up-to-date.  Epic EHR error-patient does not need pneumonia shot -tdap today as baby on the way - wants to hold off on covid Immunization History  Administered Date(s) Administered   Influenza,inj,Quad PF,6+ Mos 11/22/2018   Influenza-Unspecified 11/18/2016   Tdap 11/18/2016  4. Prostate cancer screening-  No family history, start at age 35 5. Colon cancer screening -  No family history, start at age 35. Mom did have a polyp in her past  but was told benign - was not recommended to have other family members screened early  6. Skin cancer screening/prevention- no dermatologist but wants to establish- refer today. Advised regular sunscreen use. Denies worrisome, changing, or new skin lesions.  7. Testicular cancer screening- advised monthly self exams  8. STD screening- patient opts out as monogamous  9. Smoking associated screening- never smoker  Status of chronic or acute concerns   #Social Update-son going on 5 and daughter due in march! Finished his masters!  #hyperlipidemia S: Medication: None Lab Results  Component Value Date   CHOL 263 (H) 11/22/2018   HDL 107.10 11/22/2018   LDLCALC 148 (H) 11/22/2018   TRIG 41.0 11/22/2018   CHOLHDL 2 11/22/2018   A/P: Previously with mild elevation in lipid level-we will update lipid panel as well as CBC and CMP today.  Triglycerides under 400s are reasonable to do without fasting    #Finger pain- left 4th finger- he is right handed S:swelling on left 4th finger PIP- feels somewhat swollen. Usually after putting lotion on son- may get a catching sensation. Denies trauma history. Does work with his hands.  No known overuse injury or thing he does repetitively on that side. Slightly better recently. Pain mild achle 1/10.  A/P: with long term issues and pain being more radial side and worried about tendon issus I think ultrasound more helpful than x-ray- wants to see Dr. Berline Choughigby who he has seen  in past for sports medicine   # Low back pain S:some back issues off and on since college - injury at end of college on sacrum - some sciatic pain symptoms in past -recently started week before Christmas- may flare up once a year or so. Usually gets better with stretching/yoga- but has not improved yet - no radicular pain -all in low back - closing in on a month of symptoms -located near tail bone- with movement forward/bending bothers him A/P: acute flare up of something that has  bothered him intermittently for years- would like to get sports med opinion. Had cousin with a type of cancer that is affecting his spine and that weights on him some.   - continue yoga- just has had fair amount of travel- may clam down with being back home as well  Recommended follow up: Return in about 1 year (around 11/05/2022) for physical or sooner if needed.  Lab/Order associations:NOT fasting- pancakes and egg this AM, salad and tomato stew for lunch   ICD-10-CM   1. Preventative health care  Z00.00 Ambulatory referral to Dermatology    CBC with Differential/Platelet    Comprehensive metabolic panel    Lipid panel    Hepatitis C antibody    2. Screening exam for skin cancer  Z12.83 Ambulatory referral to Dermatology    3. Encounter for hepatitis C screening test for low risk patient  Z11.59 Hepatitis C antibody  4. Hyperlipidemia, unspecified hyperlipidemia type  E78.5 CBC with Differential/Platelet    Comprehensive metabolic panel    Lipid panel    5. Acute bilateral low back pain without sciatica  M54.50 Ambulatory referral to Sports Medicine    6. Finger pain, left  M79.645 Ambulatory referral to Sports Medicine      No orders of the defined types were placed in this encounter.  I,Harris Phan,acting as a Neurosurgeon for Tana Conch, MD.,have documented all relevant documentation on the behalf of Tana Conch, MD,as directed by  Tana Conch, MD while in the presence of Tana Conch, MD.    I, Tana Conch, MD, have reviewed all documentation for this visit. The documentation on 11/05/21 for the exam, diagnosis, procedures, and orders are all accurate and complete.   Return precautions advised.   Tana Conch, MD

## 2021-11-06 ENCOUNTER — Encounter: Payer: Self-pay | Admitting: Family Medicine

## 2021-11-06 LAB — COMPREHENSIVE METABOLIC PANEL
ALT: 45 U/L (ref 0–53)
AST: 31 U/L (ref 0–37)
Albumin: 4.6 g/dL (ref 3.5–5.2)
Alkaline Phosphatase: 64 U/L (ref 39–117)
BUN: 16 mg/dL (ref 6–23)
CO2: 27 mEq/L (ref 19–32)
Calcium: 9.8 mg/dL (ref 8.4–10.5)
Chloride: 105 mEq/L (ref 96–112)
Creatinine, Ser: 0.89 mg/dL (ref 0.40–1.50)
GFR: 112.09 mL/min (ref >60.00)
Glucose, Bld: 88 mg/dL (ref 70–99)
Potassium: 3.8 mEq/L (ref 3.5–5.1)
Sodium: 139 mEq/L (ref 135–145)
Total Bilirubin: 0.5 mg/dL (ref 0.2–1.2)
Total Protein: 7.1 g/dL (ref 6.0–8.3)

## 2021-11-06 LAB — LIPID PANEL
Cholesterol: 222 mg/dL — ABNORMAL HIGH (ref 0–200)
HDL: 104.5 mg/dL (ref >39.00)
LDL Cholesterol: 105 mg/dL — ABNORMAL HIGH (ref 0–99)
NonHDL: 117.28
Total CHOL/HDL Ratio: 2
Triglycerides: 59 mg/dL (ref 0.0–149.0)
VLDL: 11.8 mg/dL (ref 0.0–40.0)

## 2021-11-06 LAB — HEPATITIS C ANTIBODY
Hepatitis C Ab: NONREACTIVE
SIGNAL TO CUT-OFF: 0.07 (ref <1.00)

## 2021-11-06 LAB — CBC WITH DIFFERENTIAL/PLATELET
Basophils Absolute: 0 10*3/uL (ref 0.0–0.1)
Basophils Relative: 0.4 % (ref 0.0–3.0)
Eosinophils Absolute: 0 10*3/uL (ref 0.0–0.7)
Eosinophils Relative: 1 % (ref 0.0–5.0)
HCT: 41.1 % (ref 39.0–52.0)
Hemoglobin: 13.6 g/dL (ref 13.0–17.0)
Lymphocytes Relative: 44.2 % (ref 12.0–46.0)
Lymphs Abs: 1.8 10*3/uL (ref 0.7–4.0)
MCHC: 33.1 g/dL (ref 30.0–36.0)
MCV: 90.5 fl (ref 78.0–100.0)
Monocytes Absolute: 0.3 10*3/uL (ref 0.1–1.0)
Monocytes Relative: 6.5 % (ref 3.0–12.0)
Neutro Abs: 2 10*3/uL (ref 1.4–7.7)
Neutrophils Relative %: 47.9 % (ref 43.0–77.0)
Platelets: 195 10*3/uL (ref 150.0–400.0)
RBC: 4.54 Mil/uL (ref 4.22–5.81)
RDW: 13.5 % (ref 11.5–15.5)
WBC: 4.1 10*3/uL (ref 4.0–10.5)

## 2021-11-18 ENCOUNTER — Telehealth: Payer: Self-pay | Admitting: Dermatology

## 2021-11-18 NOTE — Telephone Encounter (Signed)
Appt. August 9 @3 :00 w/ST. Please mark as a referral from Dr. 01

## 2021-11-19 ENCOUNTER — Ambulatory Visit
Admission: RE | Admit: 2021-11-19 | Discharge: 2021-11-19 | Disposition: A | Discharge status: Home / Self Care | Payer: BC Managed Care – PPO | Source: Ambulatory Visit | Attending: Sports Medicine | Admitting: Sports Medicine

## 2021-11-19 ENCOUNTER — Other Ambulatory Visit: Payer: Self-pay

## 2021-11-19 ENCOUNTER — Other Ambulatory Visit: Payer: Self-pay | Admitting: Sports Medicine

## 2021-11-19 DIAGNOSIS — M545 Low back pain, unspecified: Secondary | ICD-10-CM

## 2021-11-19 DIAGNOSIS — M9904 Segmental and somatic dysfunction of sacral region: Secondary | ICD-10-CM | POA: Diagnosis not present

## 2021-11-19 DIAGNOSIS — M79645 Pain in left finger(s): Secondary | ICD-10-CM | POA: Diagnosis not present

## 2021-11-19 DIAGNOSIS — M9903 Segmental and somatic dysfunction of lumbar region: Secondary | ICD-10-CM | POA: Diagnosis not present

## 2021-11-19 NOTE — Telephone Encounter (Signed)
Referral attached to appointment

## 2021-12-03 DIAGNOSIS — M9904 Segmental and somatic dysfunction of sacral region: Secondary | ICD-10-CM | POA: Diagnosis not present

## 2021-12-03 DIAGNOSIS — M545 Low back pain, unspecified: Secondary | ICD-10-CM | POA: Diagnosis not present

## 2021-12-03 DIAGNOSIS — M9905 Segmental and somatic dysfunction of pelvic region: Secondary | ICD-10-CM | POA: Diagnosis not present

## 2022-04-08 DIAGNOSIS — M25551 Pain in right hip: Secondary | ICD-10-CM | POA: Diagnosis not present

## 2022-04-08 DIAGNOSIS — M545 Low back pain, unspecified: Secondary | ICD-10-CM | POA: Diagnosis not present

## 2022-04-08 DIAGNOSIS — M25552 Pain in left hip: Secondary | ICD-10-CM | POA: Diagnosis not present

## 2022-04-08 DIAGNOSIS — M9903 Segmental and somatic dysfunction of lumbar region: Secondary | ICD-10-CM | POA: Diagnosis not present

## 2022-06-04 ENCOUNTER — Ambulatory Visit: Payer: BC Managed Care – PPO | Admitting: Dermatology

## 2022-07-21 ENCOUNTER — Encounter: Payer: Self-pay | Admitting: *Deleted

## 2022-07-28 IMAGING — CR DG LUMBAR SPINE COMPLETE 4+V
5 series · 5 of 5 positions shown · non-contrast
Comparison: None.

CLINICAL DATA: Low back pain.

EXAM:
LUMBAR SPINE - COMPLETE 4+ VIEW

[t lumbar spine ap]
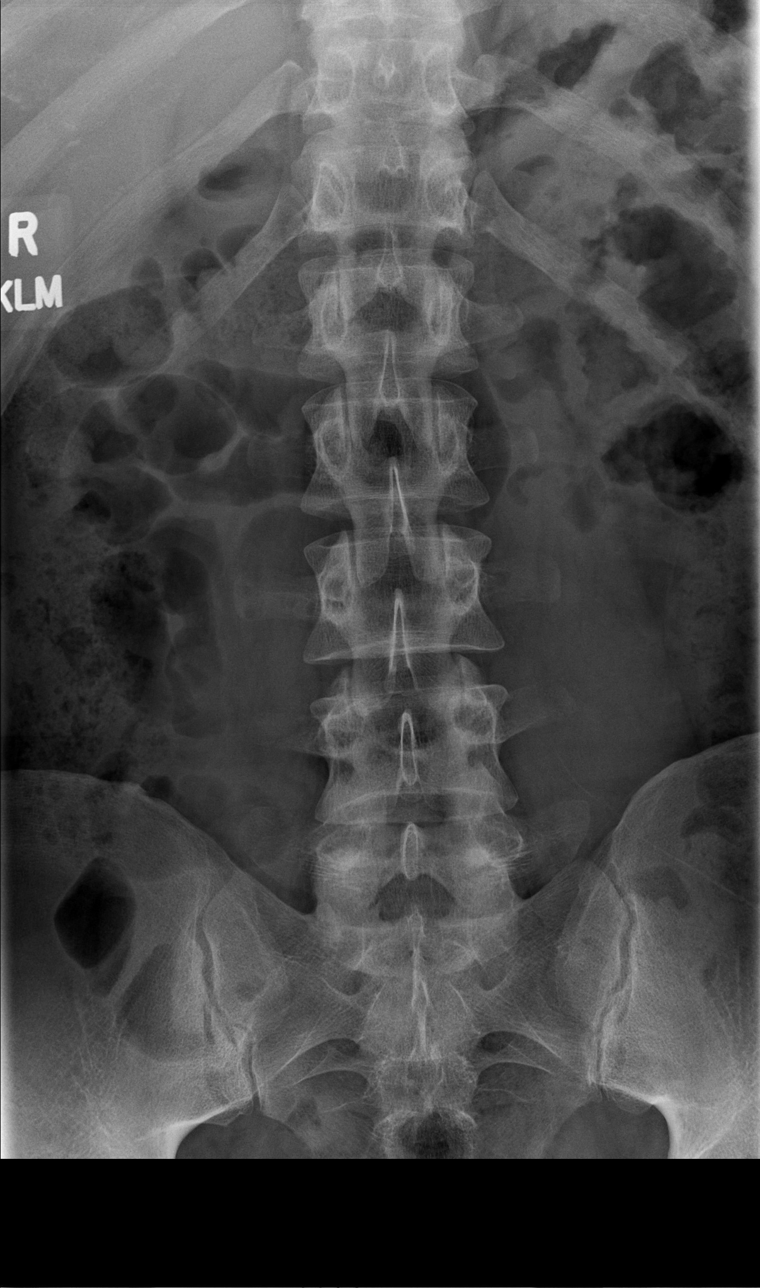

[t lumbar spine obl (1 of 2)]
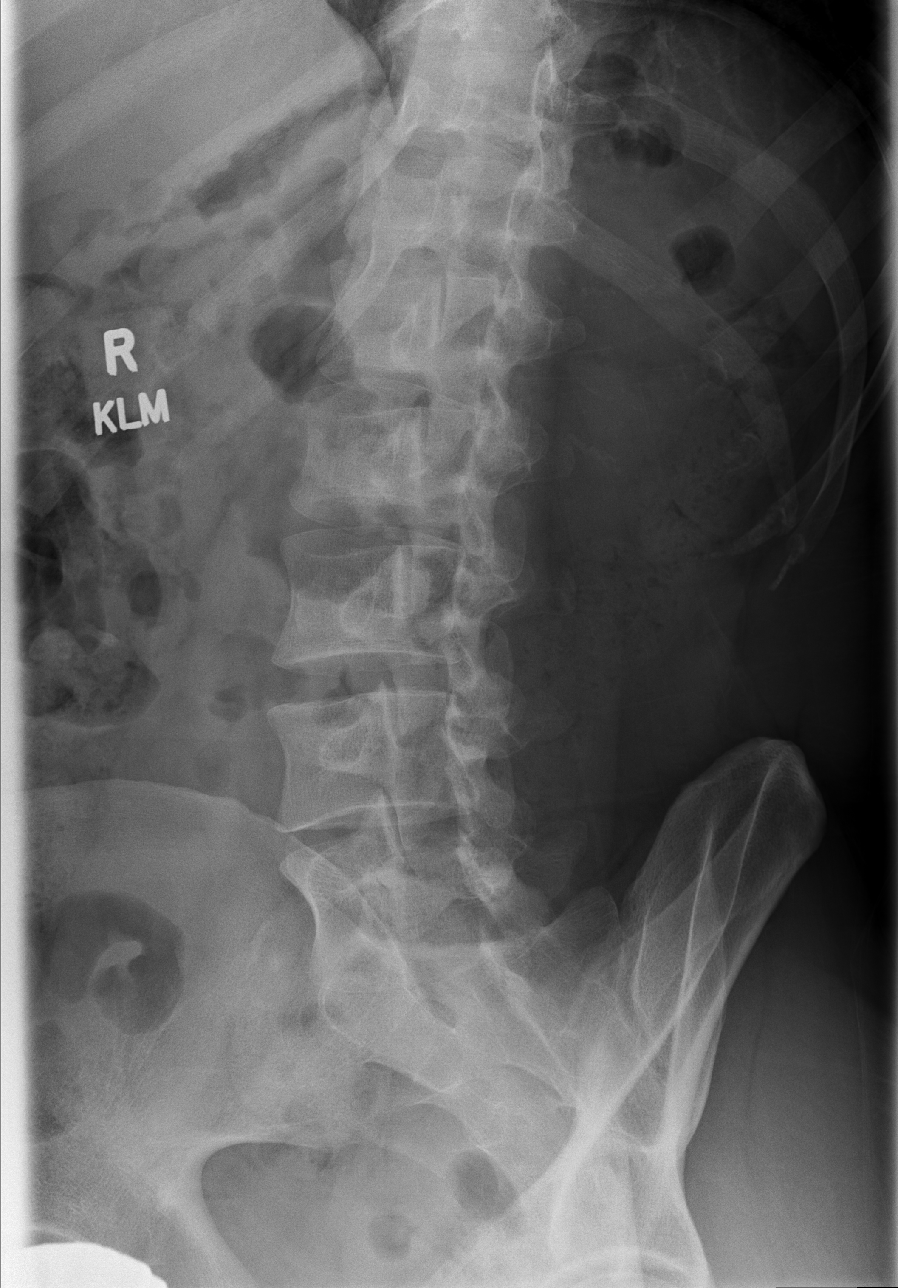

[t lumbar spine obl (2 of 2)]
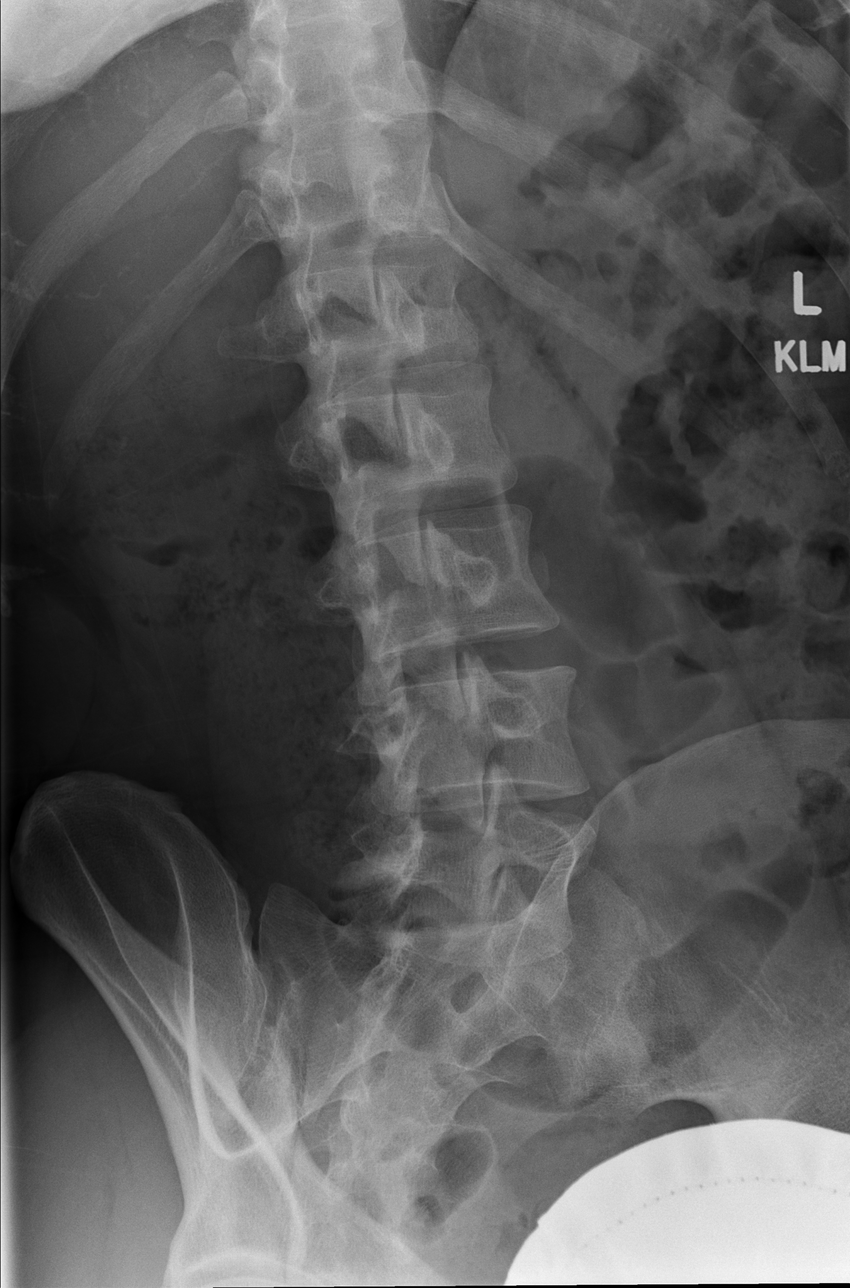

[t lumbar spine lat]
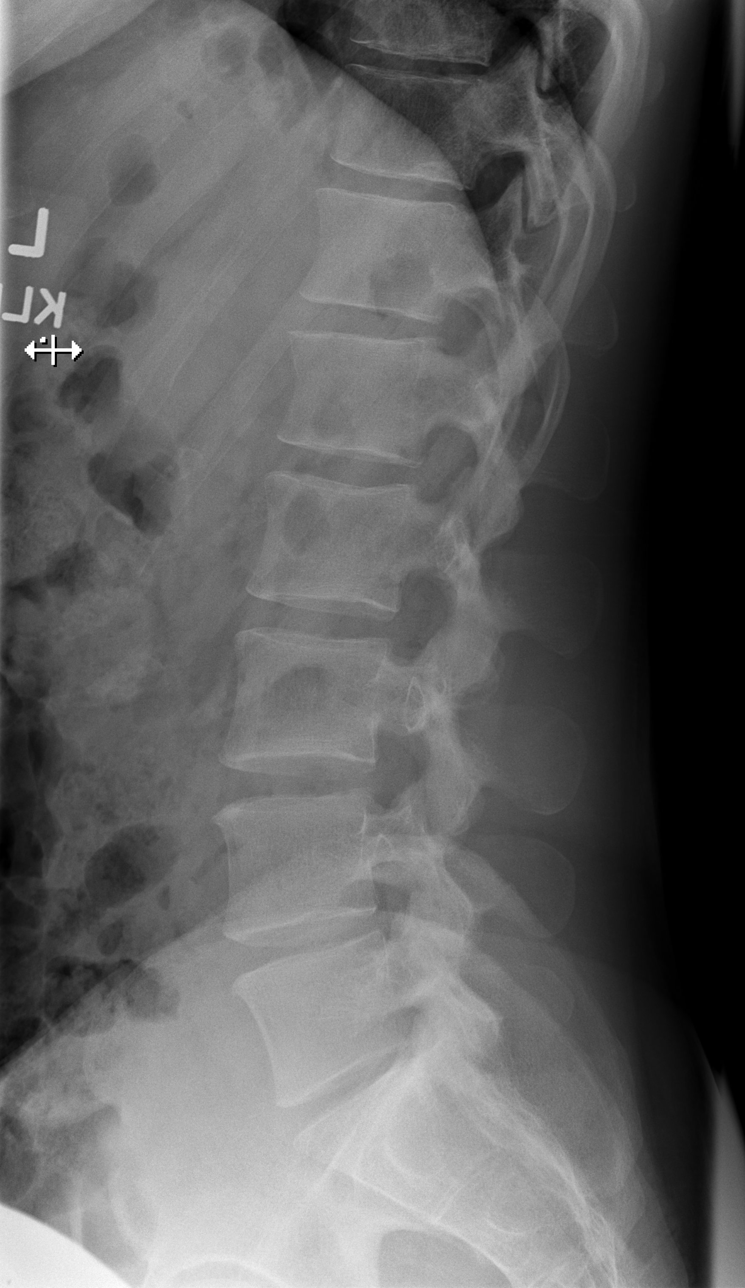

[t lumbar l-5 s-1 spot]
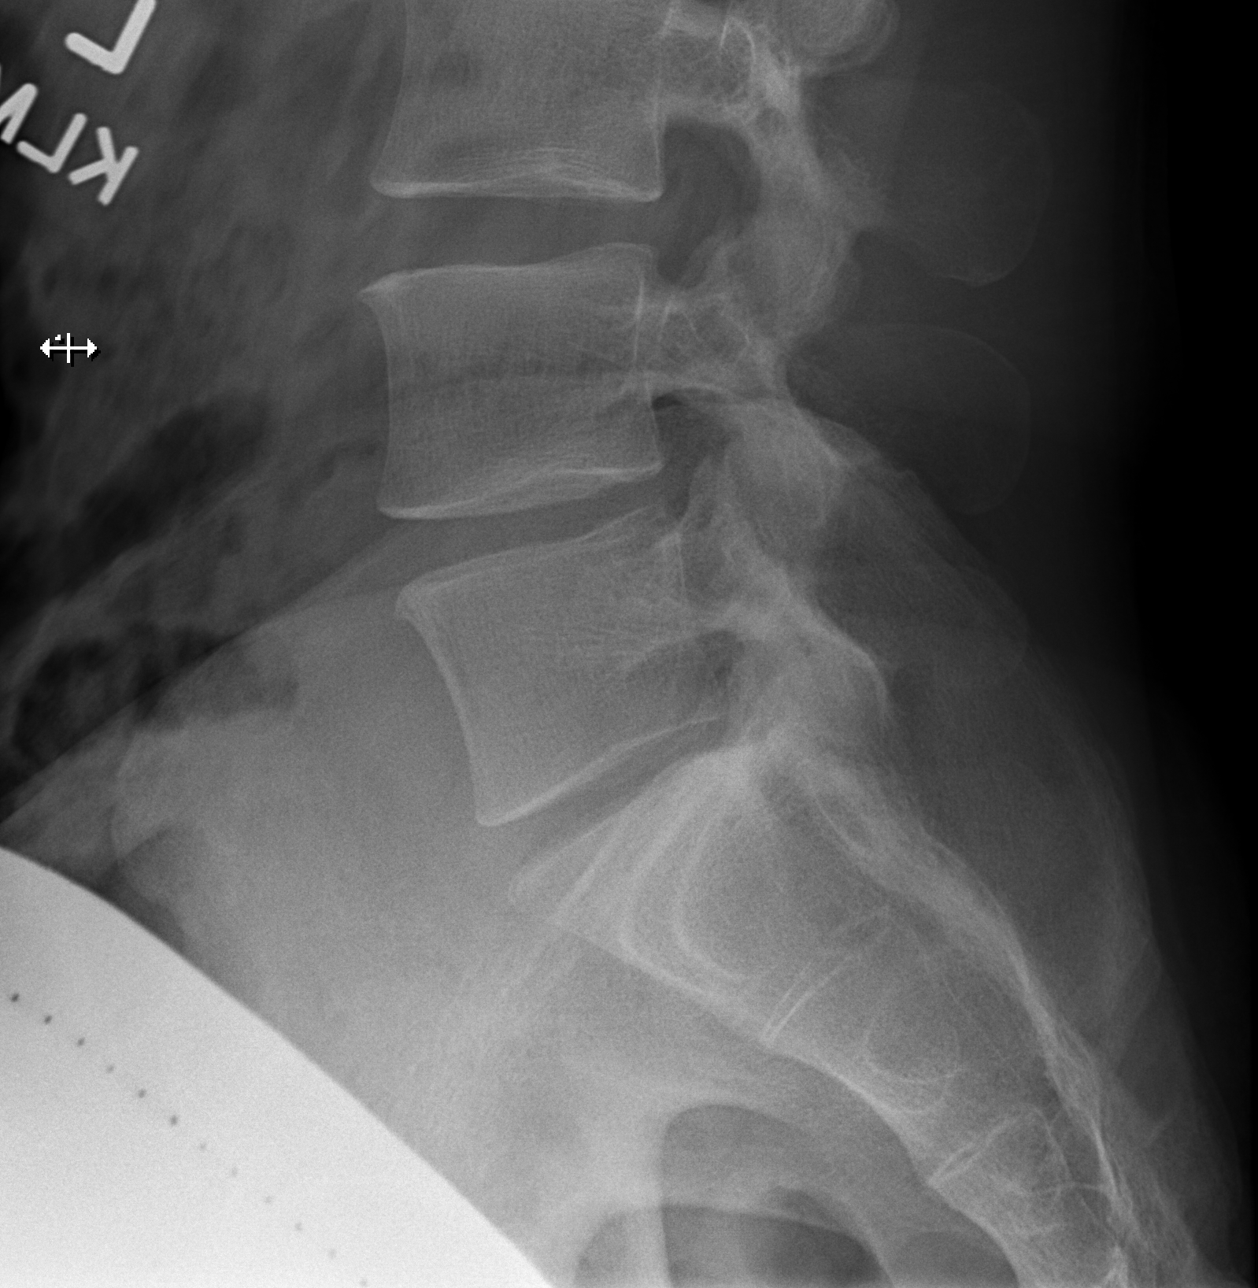

[5 of 5 positions shown; findings below may reference images not displayed]

FINDINGS: Five lumbar type vertebra. There is no acute fracture or subluxation
of the lumbar spine. The vertebral body heights and disc spaces are
maintained. The visualized posterior elements are intact. The soft
tissues are unremarkable.
IMPRESSION: Negative.

## 2022-10-09 ENCOUNTER — Encounter: Payer: Self-pay | Admitting: *Deleted

## 2022-12-11 ENCOUNTER — Ambulatory Visit (INDEPENDENT_AMBULATORY_CARE_PROVIDER_SITE_OTHER): Payer: BC Managed Care – PPO | Admitting: Family Medicine

## 2022-12-11 ENCOUNTER — Encounter: Payer: Self-pay | Admitting: Family Medicine

## 2022-12-11 VITALS — BP 110/72 | HR 53 | Temp 97.9°F | Ht 74.0 in | Wt 214.2 lb

## 2022-12-11 DIAGNOSIS — E785 Hyperlipidemia, unspecified: Secondary | ICD-10-CM | POA: Diagnosis not present

## 2022-12-11 DIAGNOSIS — Z3009 Encounter for other general counseling and advice on contraception: Secondary | ICD-10-CM

## 2022-12-11 DIAGNOSIS — Z0000 Encounter for general adult medical examination without abnormal findings: Secondary | ICD-10-CM | POA: Diagnosis not present

## 2022-12-11 DIAGNOSIS — Z1283 Encounter for screening for malignant neoplasm of skin: Secondary | ICD-10-CM | POA: Diagnosis not present

## 2022-12-11 LAB — LIPID PANEL
Cholesterol: 211 mg/dL — ABNORMAL HIGH (ref 0–200)
HDL: 90.4 mg/dL (ref >39.00)
LDL Cholesterol: 112 mg/dL — ABNORMAL HIGH (ref 0–99)
NonHDL: 120.18
Total CHOL/HDL Ratio: 2
Triglycerides: 39 mg/dL (ref 0.0–149.0)
VLDL: 7.8 mg/dL (ref 0.0–40.0)

## 2022-12-11 LAB — COMPREHENSIVE METABOLIC PANEL
ALT: 17 U/L (ref 0–53)
AST: 22 U/L (ref 0–37)
Albumin: 4.5 g/dL (ref 3.5–5.2)
Alkaline Phosphatase: 62 U/L (ref 39–117)
BUN: 12 mg/dL (ref 6–23)
CO2: 26 mEq/L (ref 19–32)
Calcium: 9.9 mg/dL (ref 8.4–10.5)
Chloride: 104 mEq/L (ref 96–112)
Creatinine, Ser: 1 mg/dL (ref 0.40–1.50)
GFR: 97.69 mL/min (ref >60.00)
Glucose, Bld: 91 mg/dL (ref 70–99)
Potassium: 3.9 mEq/L (ref 3.5–5.1)
Sodium: 140 mEq/L (ref 135–145)
Total Bilirubin: 0.8 mg/dL (ref 0.2–1.2)
Total Protein: 7 g/dL (ref 6.0–8.3)

## 2022-12-11 LAB — CBC WITH DIFFERENTIAL/PLATELET
Basophils Absolute: 0 10*3/uL (ref 0.0–0.1)
Basophils Relative: 0.5 % (ref 0.0–3.0)
Eosinophils Absolute: 0 10*3/uL (ref 0.0–0.7)
Eosinophils Relative: 1.1 % (ref 0.0–5.0)
HCT: 42.5 % (ref 39.0–52.0)
Hemoglobin: 14.4 g/dL (ref 13.0–17.0)
Lymphocytes Relative: 50.1 % — ABNORMAL HIGH (ref 12.0–46.0)
Lymphs Abs: 2 10*3/uL (ref 0.7–4.0)
MCHC: 33.8 g/dL (ref 30.0–36.0)
MCV: 90.5 fl (ref 78.0–100.0)
Monocytes Absolute: 0.3 10*3/uL (ref 0.1–1.0)
Monocytes Relative: 7.3 % (ref 3.0–12.0)
Neutro Abs: 1.7 10*3/uL (ref 1.4–7.7)
Neutrophils Relative %: 41 % — ABNORMAL LOW (ref 43.0–77.0)
Platelets: 220 10*3/uL (ref 150.0–400.0)
RBC: 4.7 Mil/uL (ref 4.22–5.81)
RDW: 13 % (ref 11.5–15.5)
WBC: 4.1 10*3/uL (ref 4.0–10.5)

## 2022-12-11 NOTE — Patient Instructions (Addendum)
Please stop by lab before you go If you have mychart- we will send your results within 3 business days of [place] receiving them.  If you do not have mychart- we will call you about results within 5 business days of [place] receiving them.  *please also note that you will see labs on mychart as soon as they post. I will later go in and write notes on them- will say "notes from Dr. Yong Channel"   We will either call you (or see alternate below) within two weeks about your referral to urology and dermatology. Our referral specialist will sometimes also send you a mychart link once referral is approved and then you will call the # listed on there (let us know if you do not see this within 2 weeks or have not received call)  Only other thing- would love to see you add in twice a week weight training- I think that's a great plan- try to prioritize this without losing sleep!   Recommended follow up: Return in about 1 year (around 12/12/2023) for physical or sooner if needed.Schedule b4 you leave.

## 2022-12-11 NOTE — Progress Notes (Signed)
Phone: [phone]    Subjective:  Patient presents today for their annual physical. Chief complaint-noted.   See problem oriented charting- ROS- full  review of systems was completed and negative  Per full ROS sheet completed by patient  The following were reviewed and entered/updated in epic: Past Medical History:  Diagnosis Date   Healthy adult    Patient Active Problem List   Diagnosis Date Noted   Knee pain, acute 03/04/2017   Past Surgical History:  Procedure Laterality Date   WISDOM TOOTH EXTRACTION     Family History  Problem Relation Age of Onset   Colon polyps Mother        does not think precancerous   Healthy Father    Hypertension Paternal Grandfather    Medications- reviewed and updated No current outpatient medications on file.   No current facility-administered medications for this visit.   Allergies-reviewed and updated No Known Allergies  Social History   Social History Narrative   Family: Married. Son 2018 and daughter due march 2023- lkely last child.       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 [employer] in dec 2022 as well.       Hobbies: playing rugby but stopped due to concussion.  hockey once a week.    Regular exercise. 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      Objective:  BP 110/72   Pulse (!) 53   Temp 97.9 F (36.6 C)   Ht 6' 2"$  (1.88 m)   Wt 214 lb 3.2 oz (97.2 kg)   SpO2 98%   BMI 27.50 kg/m  Gen: NAD, resting comfortably HEENT: Mucous membranes are moist. Oropharynx normal Neck: no thyromegaly CV: RRR no murmurs rubs or gallops Lungs: CTAB no crackles, wheeze, rhonchi Abdomen: soft/nontender/nondistended/normal bowel sounds. No rebound or guarding.  Ext: no edema Skin: warm, dry Neuro: grossly normal, moves all extremities, PERRLA    Assessment and Plan:  37 y.o. male presenting for annual physical.  Health  Maintenance counseling: 1. 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   2. Risk factor reduction:  Advised patient of need for regular exercise and diet rich and fruits and vegetables to reduce risk of heart attack and stroke.  Exercise-active with work and last year was doing once a week hockey-this year still doing hockey and active with work but trying to add in more exercise- challenge with time.  Diet/weight management-has done well in the past with intermittent fasting-wants to continue to work towards goal of 200-down 5 pounds from last year but didn't do that-has not changed much- he is hoping didn't lose muscle mass . Leaning more towards weights Wt Readings from Last 3 Encounters:  12/11/22 214 lb 3.2 oz (97.2 kg)  11/05/21 219 lb (99.3 kg)  11/22/18 204 lb 3.2 oz (92.6 kg)  3. Immunizations/screenings/ancillary studies-declines flu and COVID shot  Immunization History  Administered Date(s) Administered   Influenza,inj,Quad PF,6+ Mos 11/22/2018, 11/05/2021   Influenza-Unspecified 11/18/2016   Pfizer Covid-19 Vaccine Bivalent Booster 79yr & up 01/05/2020, 02/15/2020, 09/01/2020, 07/18/2021   Tdap 11/18/2016, 11/05/2021  4. Prostate cancer screening-  No family history, start at age 36  5 Colon cancer screening -  No  family history, start at age 20. Mom did have a polyp in her past but was told benign  - was not recommended to have other family members screened early  62. Skin cancer screening/prevention- practice we referred him to last year has closed- never saw them- follow up was in [place] and wanted to hold off- . Advised regular sunscreen use. Denies worrisome, changing, or new skin lesions.  7. Testicular cancer screening- advised monthly self exams   8. STD  screening- patient opts out as monogamous   9. Smoking associated screening- never smoker  Status of chronic or acute concerns   # Social update-his son is going on 16 and daughter will be 1 in March 25. He is ready to proceed with vasectomy- referral placed today to urology. May be moving to get more space- sharing space with baby.   #hyperlipidemia S: Medication: None-only mild elevations -no family CV history Lab Results  Component Value Date   CHOL 222 (H) 11/05/2021   HDL 104.50 11/05/2021   LDLCALC 105 (H) 11/05/2021   TRIG 59.0 11/05/2021   CHOLHDL 2 11/05/2021   A/P: mild elevations- update lipids and continue to work on lifestyle  # Left fourth finger pain-refer to Dr. Paulla Fore last year-he reports feeling better but intermittent issues- no clear underlying diagnosis   # Low back pain-on and off issue since college.  History of sacrum injury at end of college.  Some sciatic pain symptoms in the past.  Last year had flared up before physical and we opted to get Dr. Nicolasa Ducking opinion - has been doing videos for functional movement online   Recommended follow up: Return in about 1 year (around 12/12/2023) for physical or sooner if needed.Schedule b4 you leave.  Lab/Order associations: fasting   ICD-10-CM   1. Preventative health care  Z00.00     2. Mild hyperlipidemia  E78.5     3. Vasectomy evaluation  Z30.09     4. Screening exam for skin cancer  Z12.83      No orders of the defined types were placed in this encounter.  Return precautions advised.  Garret Reddish, MD

## 2023-02-03 DIAGNOSIS — D225 Melanocytic nevi of trunk: Secondary | ICD-10-CM | POA: Diagnosis not present

## 2023-02-03 DIAGNOSIS — L578 Other skin changes due to chronic exposure to nonionizing radiation: Secondary | ICD-10-CM | POA: Diagnosis not present

## 2023-02-03 DIAGNOSIS — L814 Other melanin hyperpigmentation: Secondary | ICD-10-CM | POA: Diagnosis not present

## 2023-11-12 DIAGNOSIS — Z3009 Encounter for other general counseling and advice on contraception: Secondary | ICD-10-CM | POA: Diagnosis not present

## 2023-12-14 ENCOUNTER — Ambulatory Visit (INDEPENDENT_AMBULATORY_CARE_PROVIDER_SITE_OTHER): Payer: BC Managed Care – PPO | Admitting: Family Medicine

## 2023-12-14 ENCOUNTER — Encounter: Payer: Self-pay | Admitting: Family Medicine

## 2023-12-14 VITALS — BP 120/70 | HR 79 | Temp 98.2°F | Ht 74.0 in | Wt 217.0 lb

## 2023-12-14 DIAGNOSIS — Z130 Encounter for screening for diseases of the blood and blood-forming organs and certain disorders involving the immune mechanism: Secondary | ICD-10-CM

## 2023-12-14 DIAGNOSIS — Z0000 Encounter for general adult medical examination without abnormal findings: Secondary | ICD-10-CM | POA: Diagnosis not present

## 2023-12-14 DIAGNOSIS — Z13220 Encounter for screening for lipoid disorders: Secondary | ICD-10-CM | POA: Diagnosis not present

## 2023-12-14 LAB — CBC WITH DIFFERENTIAL/PLATELET
Basophils Absolute: 0 10*3/uL (ref 0.0–0.1)
Basophils Relative: 0.6 % (ref 0.0–3.0)
Eosinophils Absolute: 0.1 10*3/uL (ref 0.0–0.7)
Eosinophils Relative: 1.1 % (ref 0.0–5.0)
HCT: 42.3 % (ref 39.0–52.0)
Hemoglobin: 14.3 g/dL (ref 13.0–17.0)
Lymphocytes Relative: 51.9 % — ABNORMAL HIGH (ref 12.0–46.0)
Lymphs Abs: 2.5 10*3/uL (ref 0.7–4.0)
MCHC: 33.9 g/dL (ref 30.0–36.0)
MCV: 91.8 fL (ref 78.0–100.0)
Monocytes Absolute: 0.3 10*3/uL (ref 0.1–1.0)
Monocytes Relative: 5.2 % (ref 3.0–12.0)
Neutro Abs: 2 10*3/uL (ref 1.4–7.7)
Neutrophils Relative %: 41.2 % — ABNORMAL LOW (ref 43.0–77.0)
Platelets: 210 10*3/uL (ref 150.0–400.0)
RBC: 4.6 Mil/uL (ref 4.22–5.81)
RDW: 13.1 % (ref 11.5–15.5)
WBC: 4.9 10*3/uL (ref 4.0–10.5)

## 2023-12-14 LAB — COMPREHENSIVE METABOLIC PANEL
ALT: 22 U/L (ref 0–53)
AST: 32 U/L (ref 0–37)
Albumin: 4.7 g/dL (ref 3.5–5.2)
Alkaline Phosphatase: 64 U/L (ref 39–117)
BUN: 13 mg/dL (ref 6–23)
CO2: 28 meq/L (ref 19–32)
Calcium: 9.6 mg/dL (ref 8.4–10.5)
Chloride: 104 meq/L (ref 96–112)
Creatinine, Ser: 0.99 mg/dL (ref 0.40–1.50)
GFR: 98.18 mL/min (ref >60.00)
Glucose, Bld: 93 mg/dL (ref 70–99)
Potassium: 3.7 meq/L (ref 3.5–5.1)
Sodium: 140 meq/L (ref 135–145)
Total Bilirubin: 0.5 mg/dL (ref 0.2–1.2)
Total Protein: 7.3 g/dL (ref 6.0–8.3)

## 2023-12-14 LAB — LIPID PANEL
Cholesterol: 226 mg/dL — ABNORMAL HIGH (ref 0–200)
HDL: 100 mg/dL (ref >39.00)
LDL Cholesterol: 113 mg/dL — ABNORMAL HIGH (ref 0–99)
NonHDL: 125.57
Total CHOL/HDL Ratio: 2
Triglycerides: 63 mg/dL (ref 0.0–149.0)
VLDL: 12.6 mg/dL (ref 0.0–40.0)

## 2023-12-14 NOTE — Patient Instructions (Addendum)
 Update your eye exam   Please stop by lab before you go If you have mychart- we will send your results within 3 business days of [place] receiving them.  If you do not have mychart- we will call you about results within 5 business days of [place] receiving them.  *please also note that you will see labs on mychart as soon as they post. I will later go in and write notes on them- will say "notes from Dr. Durene Cal"   Recommended follow up: Return in about 1 year (around 12/13/2024) for physical or sooner if needed.Schedule b4 you leave.

## 2023-12-14 NOTE — Progress Notes (Signed)
 Phone: [phone]   Subjective:  Patient presents today for their annual physical. Chief complaint-noted.   See problem oriented charting- ROS- full  review of systems was completed and negative  except for: mild back pain issues- does better with exercises   The following were reviewed and entered/updated in epic: Past Medical History:  Diagnosis Date   Healthy adult    Patient Active Problem List   Diagnosis Date Noted   Knee pain, acute 03/04/2017   Past Surgical History:  Procedure Laterality Date   WISDOM TOOTH EXTRACTION      Family History  Problem Relation Age of Onset   Colon polyps Mother        does not think precancerous   Healthy Father    Hypertension Paternal Grandfather     Medications- reviewed and updated No current outpatient medications on file.   No current facility-administered medications for this visit.   Allergies-reviewed and updated No Known Allergies  Social History   Social History Narrative   Family: Married. Son 2018 and daughter due march 2023- lkely last child.       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 [employer] in dec 2022 as well.       Hobbies: playing rugby but stopped due to concussion.  hockey once a week.    Regular exercise. 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    Objective  Objective:  BP 120/70   Pulse 79   Temp 98.2 F (36.8 C) (Temporal)   Ht 6\' 2"  (1.88 m)   Wt 217 lb (98.4 kg)   SpO2 98%   BMI 27.86 kg/m  Gen: NAD, resting comfortably HEENT: Mucous membranes are moist. Oropharynx normal Neck: no thyromegaly CV: RRR no murmurs rubs or gallops Lungs: CTAB no crackles, wheeze, rhonchi Abdomen: soft/nontender/nondistended/normal bowel sounds. No rebound or guarding.  Ext: no edema Skin: warm, dry Neuro: grossly normal, moves all extremities, PERRLA   Assessment and Plan  38 y.o. male presenting for  annual physical.  Health Maintenance counseling: 1. 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   2. Risk factor reduction:  Advised patient of need for regular exercise and diet rich and fruits and vegetables to reduce risk of heart attack and stroke.  Exercise- hockey once a week just started back up, trying to get 20 minutes in 5 days a week at home- body weight Diet/weight management- goal has been 200 lbs- up 3 lbs from last year- still wants to work on that goal.  Wt Readings from Last 3 Encounters:  12/14/23 217 lb (98.4 kg)  12/11/22 214 lb 3.2 oz (97.2 kg)  11/05/21 219 lb (99.3 kg)  3. Immunizations/screenings/ancillary studies-declines COVID shot. Flu shot - declines today  Immunization History  Administered Date(s) Administered   Influenza,inj,Quad PF,6+ Mos 11/22/2018, 11/05/2021   Influenza-Unspecified 11/18/2016   Pfizer Covid-19 Vaccine Bivalent Booster 77yrs & up 01/05/2020, 02/15/2020, 09/01/2020, 07/18/2021   Tdap 11/18/2016, 11/05/2021  4. Prostate cancer screening-  no family history, start at age 65   5. Colon cancer screening -  no family history, start at age 50  6. Skin cancer screening- [facility]- good report in 2024- open ended return. advised regular sunscreen use.  Denies worrisome, changing, or new skin lesions.  7. Smoking associated screening (lung cancer screening, AAA screen 65-75, UA)- never smoker  8. STD screening - opts out as  monogamous  Status of chronic or acute concerns   # Social Kyle Brennan is going on 12 and daughter on 2.  Vasectomy referral last year- just had consult- looking at scheduling in next year- looking for best time.  -[place] going through budget cuts and hoping for donation. He knows he will land on his feet if  needed.   # Screening hyperlipidemia with no family history of cardiovascular disease-update lipid panel today   # Low back pain-off-and-on issues since college.  History of sacrum injury at the end of college.  Videos for functional movement online have been helpful  Recommended follow up: Return in about 1 year (around 12/13/2024) for physical or sooner if needed.Schedule b4 you leave.  Lab/Order associations: fasting   ICD-10-CM   1. Preventative health care  Z00.00     2. Screening for hyperlipidemia  Z13.220 Comprehensive metabolic panel    Lipid panel    3. Screening for deficiency anemia  Z13.0 CBC with Differential/Platelet      No orders of the defined types were placed in this encounter.   Return precautions advised.  Tana Conch, MD

## 2024-04-04 ENCOUNTER — Encounter: Payer: Self-pay | Admitting: Family Medicine

## 2024-09-06 ENCOUNTER — Other Ambulatory Visit (HOSPITAL_BASED_OUTPATIENT_CLINIC_OR_DEPARTMENT_OTHER): Payer: Self-pay

## 2024-09-06 MED ORDER — FLUZONE 0.5 ML IM SUSY
0.5000 mL | PREFILLED_SYRINGE | Freq: Once | INTRAMUSCULAR | 0 refills | Status: AC
  Filled 2024-09-06: qty 0.5, 1d supply, fill #0

## 2024-12-14 ENCOUNTER — Encounter: Payer: BC Managed Care – PPO | Admitting: Family Medicine
